# Patient Record
Sex: Female | Born: 1995 | Race: White | Hispanic: No | Marital: Single | Smoking: Former smoker
Health system: Southern US, Community
[De-identification: ages and names within clinical notes are randomized; demographics above are authoritative.]

## PROBLEM LIST (undated history)

## (undated) DIAGNOSIS — N12 Tubulo-interstitial nephritis, not specified as acute or chronic: Secondary | ICD-10-CM

## (undated) DIAGNOSIS — K5900 Constipation, unspecified: Secondary | ICD-10-CM

## (undated) DIAGNOSIS — G43909 Migraine, unspecified, not intractable, without status migrainosus: Secondary | ICD-10-CM

## (undated) HISTORY — PX: EYE SURGERY: SHX253

## (undated) HISTORY — DX: Migraine, unspecified, not intractable, without status migrainosus: G43.909

---
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
## 2014-01-24 ENCOUNTER — Emergency Department: Payer: Self-pay | Admitting: Emergency Medicine

## 2014-01-26 ENCOUNTER — Emergency Department: Payer: Self-pay | Admitting: Emergency Medicine

## 2014-01-26 LAB — CBC WITH DIFFERENTIAL/PLATELET
BASOS ABS: 0 10*3/uL (ref 0.0–0.1)
BASOS PCT: 0.3 %
Eosinophil #: 0.1 10*3/uL (ref 0.0–0.7)
Eosinophil %: 0.6 %
HCT: 38.1 % (ref 35.0–47.0)
HGB: 12.7 g/dL (ref 12.0–16.0)
Lymphocyte #: 1.3 10*3/uL (ref 1.0–3.6)
Lymphocyte %: 13.3 %
MCH: 30.4 pg (ref 26.0–34.0)
MCHC: 33.5 g/dL (ref 32.0–36.0)
MCV: 91 fL (ref 80–100)
MONOS PCT: 8.6 %
Monocyte #: 0.8 x10 3/mm (ref 0.2–0.9)
NEUTROS ABS: 7.4 10*3/uL — AB (ref 1.4–6.5)
Neutrophil %: 77.2 %
Platelet: 301 10*3/uL (ref 150–440)
RBC: 4.2 10*6/uL (ref 3.80–5.20)
RDW: 12.7 % (ref 11.5–14.5)
WBC: 9.5 10*3/uL (ref 3.6–11.0)

## 2014-01-26 LAB — BASIC METABOLIC PANEL
ANION GAP: 6 — AB (ref 7–16)
BUN: 8 mg/dL (ref 7–18)
CALCIUM: 9.1 mg/dL (ref 8.5–10.1)
CREATININE: 0.76 mg/dL (ref 0.60–1.30)
Chloride: 106 mmol/L (ref 98–107)
Co2: 26 mmol/L (ref 21–32)
GLUCOSE: 85 mg/dL (ref 65–99)
OSMOLALITY: 273 (ref 275–301)
Potassium: 3.6 mmol/L (ref 3.5–5.1)
Sodium: 138 mmol/L (ref 136–145)

## 2014-01-26 LAB — MONONUCLEOSIS SCREEN: Mono Test: NEGATIVE

## 2014-01-29 LAB — BETA STREP CULTURE(ARMC)

## 2014-12-09 ENCOUNTER — Emergency Department
Admission: EM | Admit: 2014-12-09 | Discharge: 2014-12-09 | Disposition: A | Discharge status: Home / Self Care | Payer: Self-pay | Attending: Emergency Medicine | Admitting: Emergency Medicine

## 2014-12-09 ENCOUNTER — Encounter: Payer: Self-pay | Admitting: Emergency Medicine

## 2014-12-09 DIAGNOSIS — R42 Dizziness and giddiness: Secondary | ICD-10-CM | POA: Insufficient documentation

## 2014-12-09 DIAGNOSIS — R110 Nausea: Secondary | ICD-10-CM | POA: Insufficient documentation

## 2014-12-09 DIAGNOSIS — Z79899 Other long term (current) drug therapy: Secondary | ICD-10-CM | POA: Insufficient documentation

## 2014-12-09 DIAGNOSIS — R51 Headache: Secondary | ICD-10-CM | POA: Insufficient documentation

## 2014-12-09 DIAGNOSIS — H6121 Impacted cerumen, right ear: Secondary | ICD-10-CM | POA: Insufficient documentation

## 2014-12-09 DIAGNOSIS — Z3202 Encounter for pregnancy test, result negative: Secondary | ICD-10-CM | POA: Insufficient documentation

## 2014-12-09 HISTORY — DX: Constipation, unspecified: K59.00

## 2014-12-09 LAB — BASIC METABOLIC PANEL
Anion gap: 5 (ref 5–15)
BUN: 9 mg/dL (ref 6–20)
CHLORIDE: 109 mmol/L (ref 101–111)
CO2: 25 mmol/L (ref 22–32)
CREATININE: 0.68 mg/dL (ref 0.44–1.00)
Calcium: 9.3 mg/dL (ref 8.9–10.3)
GFR calc Af Amer: >60 mL/min (ref >60)
GFR calc non Af Amer: >60 mL/min (ref >60)
GLUCOSE: 86 mg/dL (ref 65–99)
Potassium: 4 mmol/L (ref 3.5–5.1)
Sodium: 139 mmol/L (ref 135–145)

## 2014-12-09 LAB — URINALYSIS COMPLETE WITH MICROSCOPIC (ARMC ONLY)
BILIRUBIN URINE: NEGATIVE
Bacteria, UA: NONE SEEN
GLUCOSE, UA: NEGATIVE mg/dL
Hgb urine dipstick: NEGATIVE
Ketones, ur: NEGATIVE mg/dL
Leukocytes, UA: NEGATIVE
Nitrite: NEGATIVE
PROTEIN: NEGATIVE mg/dL
Specific Gravity, Urine: 1.005 (ref 1.005–1.030)
pH: 7 (ref 5.0–8.0)

## 2014-12-09 LAB — CBC
HCT: 37.6 % (ref 35.0–47.0)
Hemoglobin: 12.4 g/dL (ref 12.0–16.0)
MCH: 29.8 pg (ref 26.0–34.0)
MCHC: 33.1 g/dL (ref 32.0–36.0)
MCV: 90.1 fL (ref 80.0–100.0)
PLATELETS: 282 10*3/uL (ref 150–440)
RBC: 4.18 MIL/uL (ref 3.80–5.20)
RDW: 13.5 % (ref 11.5–14.5)
WBC: 7.4 10*3/uL (ref 3.6–11.0)

## 2014-12-09 LAB — GLUCOSE, CAPILLARY: Glucose-Capillary: 103 mg/dL — ABNORMAL HIGH (ref 65–99)

## 2014-12-09 LAB — POCT PREGNANCY, URINE: Preg Test, Ur: NEGATIVE

## 2014-12-09 NOTE — ED Notes (Signed)
Pt reports 3 days ago she started feeling dizzy accompanied by loss of appetite and headache.

## 2014-12-09 NOTE — ED Notes (Signed)
Pt presents with dizziness and weakness for two days.

## 2014-12-09 NOTE — ED Provider Notes (Signed)
[facility] Emergency Department Provider Note  ____________________________________________  Time seen: 1322  I have reviewed the triage vital signs and the nursing notes.   HISTORY  Chief Complaint Dizziness     HPI [place] is a 19 y.o. female who reports that she has been dizzy for the past 3 days. We clarify and confirmed that by dizzy, she means she feels off balance and is having some vertigo. She denies any near syncope or fainting episodes. She has not had this problem before. She does report that she has a mild headache. The headache and the dizziness started about the same time. She has some minimal nausea, but no vomiting.  She does not have any prior history of similar symptoms.   Past Medical History  Diagnosis Date  . Constipation     Patient Active Problem List   Diagnosis Date Noted  . 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 ABDOMINAL PAIN, RIGHT LOWER QUADRANT 03/03/2007    History reviewed. No pertinent past surgical history.  Current Outpatient Rx  Name  Route  Sig  Dispense  Refill  . polyethylene glycol powder (GLYCOLAX/MIRALAX) powder      1 CAPFUL DAILY AS DIRECTED (NEED TO BE SEEN FOR FURTHER REFILLS   527 g   0     Allergies Review of patient's allergies indicates no known allergies.  History reviewed. No pertinent family history.  Social History Social History  Substance Use Topics  . Smoking status: Never Smoker   . Smokeless tobacco: None  . Alcohol Use: No    Review of Systems  Constitutional: Negative for fatigue. ENT: Negative for congestion. Cardiovascular: Negative for chest pain. Respiratory: Negative for cough. Gastrointestinal: Negative for abdominal pain, vomiting and diarrhea. Genitourinary: Negative for dysuria. Musculoskeletal: No myalgias or injuries. Skin: Negative for rash. Neurological: Positive for headache and for vertigo. See  history of present illness   10-point ROS otherwise negative.  ____________________________________________   PHYSICAL EXAM:  VITAL SIGNS: ED Triage Vitals  Enc Vitals Group     BP 12/09/14 1205 131/85 mmHg     Pulse Rate 12/09/14 1205 74     Resp 12/09/14 1205 16     Temp 12/09/14 1205 98.4 F (36.9 C)     Temp Source 12/09/14 1205 Oral     SpO2 12/09/14 1205 100 %     Weight 12/09/14 1205 100 lb (45.36 kg)     Height 12/09/14 1205  (1.676 m)     Head Cir --      Peak Flow --      Pain Score --      Pain Loc --      Pain Edu? --      Excl. in GC? --     Constitutional:  Alert and oriented. Well appearing and in no distress. ENT   Head: Normocephalic and atraumatic.   Nose: No congestion/rhinnorhea.       Mouth: No erythema, no swelling        Ears: The left ear appears normal on exam. The right ear is occluded with very dark oxidized wax. There is no tenderness or swelling or erythema. Cardiovascular: Normal rate, regular rhythm, no murmur noted Respiratory:  Normal respiratory effort, no tachypnea.    Breath sounds are clear and equal bilaterally.  Gastrointestinal: Soft and nontender. No distention.  Back: No muscle spasm, no tenderness, no CVA tenderness. Musculoskeletal: No deformity noted. Nontender with normal range  of motion in all extremities.  No noted edema. Neurologic:  Communicative. Equal grip strength bilaterally. Normal Romberg, no pronator drift, good finger to nose coordination, ambulatory, 5 over 5 strength in all 4 extremities, cranial nerves II through XII are intact, though she does have noted strabismus which is not acute.. No gross focal neurologic deficits are appreciated.  Skin:  Skin is warm, dry. No rash noted. Psychiatric: Mood and affect are normal. Speech and behavior are normal.  ____________________________________________    LABS (pertinent positives/negatives)  Labs Reviewed  URINALYSIS COMPLETEWITH MICROSCOPIC ([facility]  ONLY) - Abnormal; Notable for the following:    Color, Urine STRAW (*)    APPearance HAZY (*)    Squamous Epithelial / LPF 6-30 (*)    All other components within normal limits  GLUCOSE, CAPILLARY - Abnormal; Notable for the following:    Glucose-Capillary 103 (*)    All other components within normal limits  BASIC METABOLIC PANEL  CBC  POC URINE PREG, ED  POCT PREGNANCY, URINE     ____________________________________________   EKG  ED ECG REPORT I, Ivon Roedel W, the attending physician, personally viewed and interpreted this ECG.   Date: 12/09/2014  EKG Time: 1251  Rate: 71  Rhythm: Normal sinus rhythm  Axis: Normal  Intervals: Normal  ST&T Change: None noted   ____________________________________________   INITIAL IMPRESSION / ASSESSMENT AND PLAN / ED COURSE  Pertinent labs & imaging results that were available during my care of the patient were reviewed by me and considered in my medical decision making (see chart for details).  Alert, overall well-appearing, 19 year old female. On exam, her right ear is occluded with dark oxidized wax. This may be contributing to her symptoms. She has a negative neurologic exam. There is no indication for imaging. Her blood tests look good, including a hemoglobin level of 12.4. Urine pregnancy is negative and there is no sign of urinary tract infection.  I counseled the patient and her family on ear wax softening and removal. At this time, this waxes deep and hardened and the option of using a curette is not realistic. We will have her use earwax softener or oil at home. She should follow-up with her regular physician or an ENT doctor as needed.  ____________________________________________   FINAL CLINICAL IMPRESSION(S) / ED DIAGNOSES  Final diagnoses:  Cerumen impaction, right  Vertigo      Darien Ramusavid W Lalania Haseman, MD 12/09/14 1350

## 2014-12-09 NOTE — Discharge Instructions (Signed)
The buildup of old earwax that is occluding her right ear may be contributing to your off-balance feeling. Use earwax softener or an oil as we discussed. Follow with her regular doctor for reexamination and ongoing care. Return to the emergency department if you feel worse or have other urgent concerns.  Cerumen Impaction The structures of the external ear canal secrete a waxy substance known as cerumen. Excess cerumen can build up in the ear canal, causing a condition known as cerumen impaction. Cerumen impaction can cause ear pain and disrupt the function of the ear. The rate of cerumen production differs for each individual. In certain individuals, the configuration of the ear canal may decrease his or her ability to naturally remove cerumen. CAUSES Cerumen impaction is caused by excessive cerumen production or buildup. RISK FACTORS  Frequent use of swabs to clean ears.  Having narrow ear canals.  Having eczema.  Being dehydrated. SIGNS AND SYMPTOMS  Diminished hearing.  Ear drainage.  Ear pain.  Ear itch. TREATMENT Treatment may involve:  Over-the-counter or prescription ear drops to soften the cerumen.  Removal of cerumen by a health care provider. This may be done with:  Irrigation with warm water. This is the most common method of removal.  Ear curettes and other instruments.  Surgery. This may be done in severe cases. HOME CARE INSTRUCTIONS  Take medicines only as directed by your health care provider.  Do not insert objects into the ear with the intent of cleaning the ear. PREVENTION  Do not insert objects into the ear, even with the intent of cleaning the ear. Removing cerumen as a part of normal hygiene is not necessary, and the use of swabs in the ear canal is not recommended.  Drink enough water to keep your urine clear or pale yellow.  Control your eczema if you have it. SEEK MEDICAL CARE IF:  You develop ear pain.  You develop bleeding from the  ear.  The cerumen does not clear after you use ear drops as directed.   This information is not intended to replace advice given to you by your health care provider. Make sure you discuss any questions you have with your health care provider.   Document Released: 02/13/2004 Document Revised: 01/26/2014 Document Reviewed: 08/22/2014 Elsevier Interactive Patient Education [employer]2016 [employer].

## 2023-10-07 NOTE — Progress Notes (Signed)
 Attempted to call pt x 2, LVM with extension to return call.

## 2023-12-02 NOTE — Progress Notes (Signed)
 C/o low back pain at work after standing long times. Discussed maternity belt and reassurance given. US  for growth today with EFW 76.9%, AC 93.6%, AFI 7.35. Will request BPP next visit for surveillance of AFI. TdaP today. Follow up in 2 weeks or sooner as needed.

## 2023-12-13 NOTE — Progress Notes (Signed)
 No concerns. BPP for AFI today with AFI 10.68 and BPP 8/8. Follow up in 2 weeks for next routine visit or sooner as needed. GBS, Gc/Chlam and HIV next visit.

## 2023-12-27 NOTE — Progress Notes (Signed)
 Feeling well GBS and labs done Return weekly

## 2024-01-05 ENCOUNTER — Inpatient Hospital Stay (HOSPITAL_COMMUNITY)
Admission: AD | Admit: 2024-01-05 | Discharge: 2024-01-05 | Disposition: A | Discharge status: Home / Self Care | Source: Home / Self Care | Attending: Obstetrics & Gynecology | Admitting: Obstetrics & Gynecology

## 2024-01-05 DIAGNOSIS — O471 False labor at or after 37 completed weeks of gestation: Secondary | ICD-10-CM | POA: Insufficient documentation

## 2024-01-05 DIAGNOSIS — Z3A37 37 weeks gestation of pregnancy: Secondary | ICD-10-CM | POA: Insufficient documentation

## 2024-01-05 DIAGNOSIS — O479 False labor, unspecified: Secondary | ICD-10-CM

## 2024-01-05 HISTORY — DX: Tubulo-interstitial nephritis, not specified as acute or chronic: N12

## 2024-01-06 ENCOUNTER — Encounter: Payer: Self-pay | Admitting: Obstetrics & Gynecology

## 2024-01-06 ENCOUNTER — Inpatient Hospital Stay (HOSPITAL_COMMUNITY)
Admission: AD | Admit: 2024-01-06 | Discharge: 2024-01-07 | DRG: 807 | Disposition: A | Discharge status: Home / Self Care | Attending: Obstetrics and Gynecology | Admitting: Obstetrics and Gynecology

## 2024-01-06 DIAGNOSIS — O9902 Anemia complicating childbirth: Secondary | ICD-10-CM | POA: Diagnosis present

## 2024-01-06 DIAGNOSIS — Z3A37 37 weeks gestation of pregnancy: Secondary | ICD-10-CM

## 2024-01-06 DIAGNOSIS — Z87891 Personal history of nicotine dependence: Secondary | ICD-10-CM

## 2024-01-06 DIAGNOSIS — D649 Anemia, unspecified: Secondary | ICD-10-CM | POA: Diagnosis present

## 2024-01-06 HISTORY — DX: Migraine, unspecified, not intractable, without status migrainosus: G43.909

## 2024-01-06 MED ORDER — OXYCODONE-ACETAMINOPHEN 5-325 MG PO TABS: 1.0000 | ORAL_TABLET | ORAL | Status: DC | PRN

## 2024-01-06 MED ORDER — OXYCODONE-ACETAMINOPHEN 5-325 MG PO TABS: 2.0000 | ORAL_TABLET | ORAL | Status: DC | PRN

## 2024-01-06 MED ADMIN — Ibuprofen Tab 600 MG: 600 mg | ORAL | NDC 60687045711

## 2024-01-06 MED ADMIN — Ibuprofen Tab 600 MG: 600 mg | ORAL | NDC 65162046510

## 2024-01-06 MED ADMIN — Fentanyl 0.5 MG/250ML-Bupiv 0.125%-NaCl 0.9% Epidural Inj: 12 mL/h | EPIDURAL | NDC 70004023140

## 2024-01-06 MED ADMIN — Acetaminophen Tab 325 MG: 650 mg | ORAL | NDC 50580047511

## 2024-01-06 MED ADMIN — Benzocaine-Menthol Aerosol 20-0.5%: 1 | TOPICAL | NDC 1686468003

## 2024-01-06 MED ADMIN — AMPICILLIN-SULBACTAM 3 GM IVPB: 3 g | INTRAVENOUS | NDC 55150011720

## 2024-01-06 MED ADMIN — Lidocaine HCl Local Preservative Free (PF) Inj 1%: 5 mL | EPIDURAL | NDC 00409471332

## 2024-01-06 MED ADMIN — Lidocaine HCl Local Preservative Free (PF) Inj 1%: 30 mL | SUBCUTANEOUS | NDC 63323049207

## 2024-01-06 MED ADMIN — GENTAMICIN EXTENDED INTERVAL DOSING IVPB: 260 mg | INTRAVENOUS | NDC 63323001003

## 2024-01-06 MED ADMIN — Witch Hazel-Glycerin Cleansing Pads: 1 | TOPICAL | NDC 50289325001

## 2024-01-06 MED ADMIN — Morphine Sulfate IV Soln PF 4 MG/ML: 4 mg | INTRAMUSCULAR | NDC 76045000501

## 2024-01-06 MED ADMIN — Lactated Ringer's Solution: 500 mL | INTRAVENOUS | NDC 00264775000

## 2024-01-06 MED ADMIN — Oxytocin Inj 10 Unit/ML: 333 mL | INTRAVENOUS | NDC 99999070057

## 2024-01-06 NOTE — Progress Notes (Signed)
 Duplicate note.  Camie Rote, MSN, CNM, RNC-OB Certified Nurse Midwife, [facility] 01/19/2024 11:48 AM

## 2024-01-07 MED ADMIN — Ibuprofen Tab 600 MG: 600 mg | ORAL | NDC 60687045711

## 2024-01-07 MED ADMIN — Acetaminophen Tab 325 MG: 650 mg | ORAL | NDC 50580047511

## 2024-01-07 MED ADMIN — PRENATAL PLUS 27-1 MG PO TABS: 1 | ORAL | NDC 39328010610

## 2024-01-07 MED ADMIN — Sennosides-Docusate Sodium Tab 8.6-50 MG: 2 | ORAL | NDC 00536124710

## 2024-01-10 ENCOUNTER — Encounter: Payer: Self-pay | Admitting: Obstetrics & Gynecology

## 2024-01-11 LAB — SURGICAL PATHOLOGY

## 2024-01-15 ENCOUNTER — Telehealth (HOSPITAL_COMMUNITY): Payer: Self-pay

## 2024-01-15 NOTE — Telephone Encounter (Signed)
 01/15/2024  Name: Emily Mcpherson MRN: [id number] DOB: 1995-06-04  Reason for Call:  Transition of Care Hospital Discharge Call  Contact Status: Patient Contact Status: Message Attempted call x2 Language assistant needed:          Follow-Up Questions:    Van Postnatal Depression Scale:  In the Past 7 Days:    PHQ2-9 Depression Scale:     Discharge Follow-up:    Post-discharge interventions: NA  Signature  Rosaline Deretha PEAK
